# Patient Record
Sex: Female | Born: 2000 | Hispanic: Yes | Marital: Single | Smoking: Never smoker
Health system: Southern US, Community
[De-identification: ages and names within clinical notes are randomized; demographics above are authoritative.]

## PROBLEM LIST (undated history)

## (undated) ENCOUNTER — Inpatient Hospital Stay (HOSPITAL_COMMUNITY): Payer: Self-pay

## (undated) DIAGNOSIS — Z789 Other specified health status: Secondary | ICD-10-CM

## (undated) HISTORY — DX: Other specified health status: Z78.9

## (undated) HISTORY — PX: NO PAST SURGERIES: SHX2092

---
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
## 2020-05-31 ENCOUNTER — Encounter (HOSPITAL_COMMUNITY): Payer: Self-pay | Admitting: Student

## 2020-05-31 ENCOUNTER — Other Ambulatory Visit: Payer: Self-pay

## 2020-05-31 DIAGNOSIS — R112 Nausea with vomiting, unspecified: Secondary | ICD-10-CM | POA: Diagnosis present

## 2020-05-31 DIAGNOSIS — U071 COVID-19: Secondary | ICD-10-CM | POA: Insufficient documentation

## 2020-05-31 NOTE — ED Triage Notes (Signed)
Patient BIB POV c/o abdominal pain, headache, N/V/D, body aches, and weakness for the past 4 days.  Patient has not been tested Covid recently and she is not vaccinated.

## 2020-06-01 ENCOUNTER — Emergency Department (HOSPITAL_COMMUNITY)
Admission: EM | Admit: 2020-06-01 | Discharge: 2020-06-01 | Disposition: A | Discharge status: Home / Self Care | Payer: Medicaid Other | Attending: Emergency Medicine | Admitting: Emergency Medicine

## 2020-06-01 ENCOUNTER — Emergency Department (HOSPITAL_COMMUNITY): Payer: Medicaid Other

## 2020-06-01 DIAGNOSIS — R112 Nausea with vomiting, unspecified: Secondary | ICD-10-CM

## 2020-06-01 DIAGNOSIS — U071 COVID-19: Secondary | ICD-10-CM

## 2020-06-01 LAB — COMPREHENSIVE METABOLIC PANEL
ALT: 15 U/L (ref 0–44)
AST: 18 U/L (ref 15–41)
Albumin: 4.3 g/dL (ref 3.5–5.0)
Alkaline Phosphatase: 62 U/L (ref 38–126)
Anion gap: 11 (ref 5–15)
BUN: 10 mg/dL (ref 6–20)
CO2: 24 mmol/L (ref 22–32)
Calcium: 9.4 mg/dL (ref 8.9–10.3)
Chloride: 101 mmol/L (ref 98–111)
Creatinine, Ser: 0.62 mg/dL (ref 0.44–1.00)
GFR, Estimated: >60 mL/min (ref >60)
Glucose, Bld: 90 mg/dL (ref 70–99)
Potassium: 3.4 mmol/L — ABNORMAL LOW (ref 3.5–5.1)
Sodium: 136 mmol/L (ref 135–145)
Total Bilirubin: 0.5 mg/dL (ref 0.3–1.2)
Total Protein: 7.9 g/dL (ref 6.5–8.1)

## 2020-06-01 LAB — CBC WITH DIFFERENTIAL/PLATELET
Abs Immature Granulocytes: 0.01 10*3/uL (ref 0.00–0.07)
Basophils Absolute: 0 10*3/uL (ref 0.0–0.1)
Basophils Relative: 0 %
Eosinophils Absolute: 0.2 10*3/uL (ref 0.0–0.5)
Eosinophils Relative: 4 %
HCT: 36.3 % (ref 36.0–46.0)
Hemoglobin: 12.2 g/dL (ref 12.0–15.0)
Immature Granulocytes: 0 %
Lymphocytes Relative: 28 %
Lymphs Abs: 1.3 10*3/uL (ref 0.7–4.0)
MCH: 30 pg (ref 26.0–34.0)
MCHC: 33.6 g/dL (ref 30.0–36.0)
MCV: 89.4 fL (ref 80.0–100.0)
Monocytes Absolute: 0.7 10*3/uL (ref 0.1–1.0)
Monocytes Relative: 15 %
Neutro Abs: 2.5 10*3/uL (ref 1.7–7.7)
Neutrophils Relative %: 53 %
Platelets: 209 10*3/uL (ref 150–400)
RBC: 4.06 MIL/uL (ref 3.87–5.11)
RDW: 12 % (ref 11.5–15.5)
WBC: 4.7 10*3/uL (ref 4.0–10.5)
nRBC: 0 % (ref 0.0–0.2)

## 2020-06-01 LAB — URINALYSIS, ROUTINE W REFLEX MICROSCOPIC
Bilirubin Urine: NEGATIVE
Glucose, UA: NEGATIVE mg/dL
Ketones, ur: NEGATIVE mg/dL
Leukocytes,Ua: NEGATIVE
Nitrite: NEGATIVE
Protein, ur: NEGATIVE mg/dL
Specific Gravity, Urine: 1.03 (ref 1.005–1.030)
pH: 6 (ref 5.0–8.0)

## 2020-06-01 LAB — RESP PANEL BY RT-PCR (FLU A&B, COVID) ARPGX2
Influenza A by PCR: NEGATIVE
Influenza B by PCR: NEGATIVE
SARS Coronavirus 2 by RT PCR: POSITIVE — AB

## 2020-06-01 LAB — I-STAT BETA HCG BLOOD, ED (MC, WL, AP ONLY): I-stat hCG, quantitative: <5 m[IU]/mL (ref <5)

## 2020-06-01 LAB — LIPASE, BLOOD: Lipase: 21 U/L (ref 11–51)

## 2020-06-01 MED ORDER — ONDANSETRON HCL 4 MG/2ML IJ SOLN
4.0000 mg | Freq: Once | INTRAMUSCULAR | Status: AC
  Administered 2020-06-01: 4 mg via INTRAVENOUS
  Filled 2020-06-01: qty 2

## 2020-06-01 MED ORDER — ONDANSETRON 4 MG PO TBDP: 4.0000 mg | ORAL_TABLET | Freq: Four times a day (QID) | ORAL | 0 refills | Status: AC

## 2020-06-01 MED ORDER — MORPHINE SULFATE (PF) 4 MG/ML IV SOLN
4.0000 mg | Freq: Once | INTRAVENOUS | Status: AC
  Administered 2020-06-01: 4 mg via INTRAVENOUS
  Filled 2020-06-01: qty 1

## 2020-06-01 MED ORDER — FAMOTIDINE IN NACL 20-0.9 MG/50ML-% IV SOLN
20.0000 mg | Freq: Once | INTRAVENOUS | Status: AC
  Administered 2020-06-01: 20 mg via INTRAVENOUS
  Filled 2020-06-01: qty 50

## 2020-06-01 MED ORDER — SODIUM CHLORIDE 0.9 % IV BOLUS
1000.0000 mL | Freq: Once | INTRAVENOUS | Status: AC
  Administered 2020-06-01: 1000 mL via INTRAVENOUS

## 2020-06-01 NOTE — ED Provider Notes (Signed)
[facility]-EMERGENCY DEPT Provider Note   CSN: [id number] Arrival date & time: 05/31/20  1809     History Chief Complaint  Patient presents with  . Emesis  . Weakness  . Nausea  . Abdominal Pain    Ariel Underwood is a 19 y.o. female.  Ariel Underwood is a 19 y.o. female who is otherwise healthy, presents to the emergency department for evaluation of abdominal pain, nausea, vomiting, diarrhea, headache and body aches.  She reports symptoms have been present for the past 4 days.  She states she has had chills but no documented fever.  Has had fatigue and general malaise.  Reports intermittent headaches, no visual changes, numbness tingling or weakness, no neck pain or stiffness.  Reports she has had persistent vomiting and has not been able to keep much of anything down and has had some intermittent nonbloody diarrhea.  Reports some generalized abdominal pain that has worsened today.  Has also had some nasal congestion and occasional cough.  Reports some chest tightness primarily after coughing, no shortness of breath.  Denies known sick contacts but was gathered with multiple family members for Thanksgiving.  Has not had Covid vaccine.  No meds prior to arrival, no other aggravating or alleviating factors.        Past Medical History:  Diagnosis Date  . Medical history non-contributory     Patient Active Problem List   Diagnosis Date Noted  . Chlamydia 12/30/2017  . Encounter for supervision of normal pregnancy in teen primigravida, antepartum 11/05/2017  . Short interval between pregnancies affecting pregnancy, antepartum 11/05/2017    Past Surgical History:  Procedure Laterality Date  . NO PAST SURGERIES       OB History    Gravida  2   Para  1   Term  1   Preterm      AB      Living  1     SAB      TAB      Ectopic      Multiple      Live Births  1           Family History  Problem Relation Age of Onset  . Diabetes  Paternal Grandmother   . Hypertension Paternal Grandmother   . Cancer Neg Hx     Social History   Tobacco Use  . Smoking status: Never Smoker  . Smokeless tobacco: Never Used  Substance Use Topics  . Alcohol use: Never  . Drug use: Never    Home Medications Prior to Admission medications   Medication Sig Start Date End Date Taking? Authorizing Provider  ibuprofen (ADVIL) 200 MG tablet Take 400 mg by mouth every 6 (six) hours as needed for moderate pain.   Yes [provider]  ondansetron (ZOFRAN ODT) 4 MG disintegrating tablet Take 1 tablet (4 mg total) by mouth every 6 (six) hours. 06/01/20   Dartha LodgeFord, Orlander Norwood N, PA-C    Allergies    Patient has no known allergies.  Review of Systems   Review of Systems  Constitutional: Positive for chills. Negative for fever.  HENT: Positive for congestion and rhinorrhea. Negative for sore throat.   Respiratory: Positive for cough. Negative for shortness of breath.   Cardiovascular: Positive for chest pain.  Gastrointestinal: Positive for abdominal pain, diarrhea, nausea and vomiting. Negative for blood in stool.  Genitourinary: Negative for dysuria, frequency, vaginal bleeding and vaginal discharge.  Musculoskeletal: Negative for arthralgias and myalgias.  Skin:  Negative for color change and rash.  Neurological: Positive for headaches. Negative for dizziness, syncope and light-headedness.  All other systems reviewed and are negative.   Physical Exam Updated Vital Signs BP (!) 100/58   Pulse 64   Temp 98.8 F (37.1 C) (Oral)   Resp 13   Ht 5\' 4"  (1.626 m)   Wt 49.9 kg   SpO2 97%   BMI 18.88 kg/m   Physical Exam Vitals and nursing note reviewed.  Constitutional:      General: She is not in acute distress.    Appearance: She is well-developed and normal weight. She is not ill-appearing or diaphoretic.     Comments: Well-appearing and in no distress  HENT:     Head: Normocephalic and atraumatic.     Nose: Congestion and  rhinorrhea present.     Mouth/Throat:     Mouth: Mucous membranes are moist.     Pharynx: Oropharynx is clear.     Comments: Posterior oropharynx clear and mucous membranes moist, there is mild erythema but no edema or tonsillar exudates, uvula midline, normal phonation Eyes:     General:        Right eye: No discharge.        Left eye: No discharge.     Pupils: Pupils are equal, round, and reactive to light.  Cardiovascular:     Rate and Rhythm: Normal rate and regular rhythm.     Heart sounds: Normal heart sounds. No murmur heard.  No friction rub. No gallop.   Pulmonary:     Effort: Pulmonary effort is normal. No respiratory distress.     Breath sounds: Normal breath sounds. No wheezing or rales.     Comments: Respirations equal and unlabored, patient able to speak in full sentences, lungs clear to auscultation bilaterally Abdominal:     General: Bowel sounds are normal. There is no distension.     Palpations: Abdomen is soft. There is no mass.     Tenderness: There is abdominal tenderness. There is no guarding.     Comments: Abdomen is soft, nondistended, bowel sounds present throughout, there is some generalized tenderness throughout the abdomen that does not localize to one quadrant, no guarding or peritoneal signs, no CVA tenderness bilaterally  Musculoskeletal:        General: No deformity.     Cervical back: Normal range of motion and neck supple. No rigidity.     Right lower leg: No edema.     Left lower leg: No edema.  Skin:    General: Skin is warm and dry.     Capillary Refill: Capillary refill takes less than 2 seconds.  Neurological:     Mental Status: She is alert and oriented to person, place, and time.     Coordination: Coordination normal.     Comments: Speech is clear, able to follow commands Moves extremities without ataxia, coordination intact  Psychiatric:        Mood and Affect: Mood normal.        Behavior: Behavior normal.     ED Results /  Procedures / Treatments   Labs (all labs ordered are listed, but only abnormal results are displayed) Labs Reviewed  RESP PANEL BY RT-PCR (FLU A&B, COVID) ARPGX2 - Abnormal; Notable for the following components:      Result Value   SARS Coronavirus 2 by RT PCR POSITIVE (*)    All other components within normal limits  COMPREHENSIVE METABOLIC PANEL - Abnormal; Notable for  the following components:   Potassium 3.4 (*)    All other components within normal limits  URINALYSIS, ROUTINE W REFLEX MICROSCOPIC - Abnormal; Notable for the following components:   Hgb urine dipstick SMALL (*)    Bacteria, UA FEW (*)    All other components within normal limits  CBC WITH DIFFERENTIAL/PLATELET  LIPASE, BLOOD  I-STAT BETA HCG BLOOD, ED ([facility], [facility], [facility] ONLY)    EKG EKG Interpretation  Date/Time:  Thursday June 01 2020 02:35:07 EST Ventricular Rate:  77 PR Interval:    QRS Duration: 87 QT Interval:  369 QTC Calculation: 418 R Axis:   79 Text Interpretation: Sinus rhythm Atrial premature complex No old tracing to compare Confirmed by Marily Memos [phone]) on 06/01/2020 6:26:07 AM   Radiology DG Chest Port 1 View  Result Date: 06/01/2020 CLINICAL DATA:  Chest pain and cough with fevers EXAM: PORTABLE CHEST 1 VIEW COMPARISON:  None. FINDINGS: The heart size and mediastinal contours are within normal limits. Both lungs are clear. The visualized skeletal structures are unremarkable. IMPRESSION: No active disease. Electronically Signed   By: Alcide Clever M.D.   On: 06/01/2020 02:48    Procedures Procedures (including critical care time)  Medications Ordered in ED Medications  sodium chloride 0.9 % bolus 1,000 mL (0 mLs Intravenous Stopped 06/01/20 0511)  ondansetron (ZOFRAN) injection 4 mg (4 mg Intravenous Given 06/01/20 0346)  morphine 4 MG/ML injection 4 mg (4 mg Intravenous Given 06/01/20 0345)  famotidine (PEPCID) IVPB 20 mg premix (0 mg Intravenous Stopped 06/01/20 0511)  ondansetron  (ZOFRAN) injection 4 mg (4 mg Intravenous Given 06/01/20 0606)    ED Course  I have reviewed the triage vital signs and the nursing notes.  Pertinent labs & imaging results that were available during my care of the patient were reviewed by me and considered in my medical decision making (see chart for details).    MDM Rules/Calculators/A&P                          19 year old female presents with chills, body aches, headache, nausea, vomiting and diarrhea over the past 4 days is also had some nasal congestion and cough, unsure of sick contacts but was gathered with family for Thanksgiving, has not been vaccinated against Covid.  On exam patient is afebrile with normal vitals, overall well-appearing and in no acute distress, abdomen with some mild generalized tenderness, does not localize to one quadrant, no peritoneal signs.  Lungs are clear.  High clinical suspicion for Covid infection, but will also check abdominal labs, chest x-ray and EKG.  Patient given IV fluids and symptomatic treatment.  Do not feel that abdominal imaging is indicated at this time.  Patient denies urinary symptoms or vaginal discharge.  I have independently ordered, reviewed and interpreted all labs and imaging: CBC: No leukocytosis, normal hemoglobin CMP: Minimal hypokalemia of 3.4, no other electrolyte derangements, normal renal and liver function Lipase: WNL Pregnancy: Negative UA: No hematuria or signs of infection.  Chest x-ray without evidence of pneumonia or other active cardiopulmonary disease.  EKG shows sinus rhythm with PAC, no ischemic changes.  Patient's Covid test has returned positive, this certainly explains her array of symptoms.  Her nausea and vomiting has improved with treatment here in the ED and she has tolerated p.o. fluids.  Abdominal exam remains benign.  At this time feel patient is stable for discharge home with continued supportive treatment for Covid symptoms.  Will discharge  with Zofran  and discussed other over-the-counter medications.  She does not have medical history that would qualify her for monoclonal antibody infusion.  Discussed outpatient follow-up and strict return precautions.  She expresses understanding and agreement.  Discharged home in good condition.    Ariel Underwood was evaluated in Emergency Department on 06/01/2020 for the symptoms described in the history of present illness. She was evaluated in the context of the global COVID-19 pandemic, which necessitated consideration that the patient might be at risk for infection with the SARS-CoV-2 virus that causes COVID-19. Institutional protocols and algorithms that pertain to the evaluation of patients at risk for COVID-19 are in a state of rapid change based on information released by regulatory bodies including the CDC and federal and state organizations. These policies and algorithms were followed during the patient's care in the ED.  Final Clinical Impression(s) / ED Diagnoses Final diagnoses:  COVID-19 virus infection  Non-intractable vomiting with nausea, unspecified vomiting type    Rx / DC Orders ED Discharge Orders         Ordered    ondansetron (ZOFRAN ODT) 4 MG disintegrating tablet  Every 6 hours        06/01/20 0624           Dartha Lodge, PA-C 06/01/20 0654    Marily Memos, MD 06/01/20 [phone]

## 2020-06-01 NOTE — Discharge Instructions (Addendum)
You have tested positive for COVID-19 virus.  Please continue to quarantine at home and monitor your symptoms closely. You chest x-ray was clear. Antibiotics are not helpful in treating viral infection, the virus should run its course in about 10-14 days. Please make sure you are drinking plenty of fluids. You can treat your symptoms supportively with tylenol for fevers and pains, and over the counter cough syrups and throat lozenges to help with cough.  Take Zofran for nausea and vomiting, start with clear liquids and as you are able to keep these down you can add back bland foods and then slowly return to your normal diet.  If your symptoms are not improving please follow up with you Primary doctor.   I recommend that you purchase a home pulse ox to help better monitor your oxygen at home, if you start to have increased work of breathing or shortness of breath or your oxygen drops below 90% please immediately return to the hospital for reevaluation.  If you develop persistent fevers, shortness of breath or difficulty breathing, chest pain, severe headache and neck pain, persistent nausea and vomiting or other new or concerning symptoms return to the Emergency department.

## 2020-06-02 ENCOUNTER — Other Ambulatory Visit: Payer: Self-pay | Admitting: Nurse Practitioner

## 2020-06-02 DIAGNOSIS — U071 COVID-19: Secondary | ICD-10-CM

## 2020-06-02 NOTE — Progress Notes (Signed)
I connected by phone with Ariel Underwood on 06/02/2020 at 12:24 PM to discuss the potential use of a treatment for mild to moderate COVID-19 viral infection in non-hospitalized patients.  This patient is a 19 y.o. female that meets the FDA criteria for Emergency Use Authorization of bamlanivimab/etesevimab, casirivimab\imdevimab, or sotrovimab  Has a (+) direct SARS-CoV-2 viral test result  Has mild or moderate COVID-19   Is ? 19 years of age and weighs ? 40 kg  Is NOT hospitalized due to COVID-19  Is NOT requiring oxygen therapy or requiring an increase in baseline oxygen flow rate due to COVID-19  Is within 10 days of symptom onset  Has at least one of the high risk factor(s) for progression to severe COVID-19 and/or hospitalization as defined in EUA.  Specific high risk criteria : Other high risk medical condition per CDC:  social vulnerability   I have spoken and communicated the following to the patient or parent/caregiver:  1. FDA has authorized the emergency use of bamlanivimab/etesevimab, casirivimab\imdevimab, or sotrovimab for the treatment of mild to moderate COVID-19 in adults and pediatric patients with positive results of direct SARS-CoV-2 viral testing who are 62 years of age and older weighing at least 40 kg, and who are at high risk for progressing to severe COVID-19 and/or hospitalization.  2. The significant known and potential risks and benefits of bamlanivimab/etesevimab, casirivimab\imdevimab, or sotrovimab, and the extent to which such potential risks and benefits are unknown.  3. Information on available alternative treatments and the risks and benefits of those alternatives, including clinical trials.  4. Patients treated with bamlanivimab/etesevimab, casirivimab\imdevimab, or sotrovimab should continue to self-isolate and use infection control measures (e.g., wear mask, isolate, social distance, avoid sharing personal items, clean and disinfect "high touch"  surfaces, and frequent handwashing) according to CDC guidelines.   5. The patient or parent/caregiver has the option to accept or refuse bamlanivimab/etesevimab, casirivimab\imdevimab, or sotrovimab.  After reviewing this information with the patient, the patient has agreed to receive one of the available covid 19 monoclonal antibodies and will be provided an appropriate fact sheet prior to infusion.Consuello Masse, DNP, AGNP-C [phone] (Infusion Center Hotline)

## 2021-07-08 IMAGING — DX DG CHEST 1V PORT
1 series · 1 of 1 positions shown · non-contrast
Comparison: None.

CLINICAL DATA: Chest pain and cough with fevers

EXAM:
PORTABLE CHEST 1 VIEW

[chest ap]
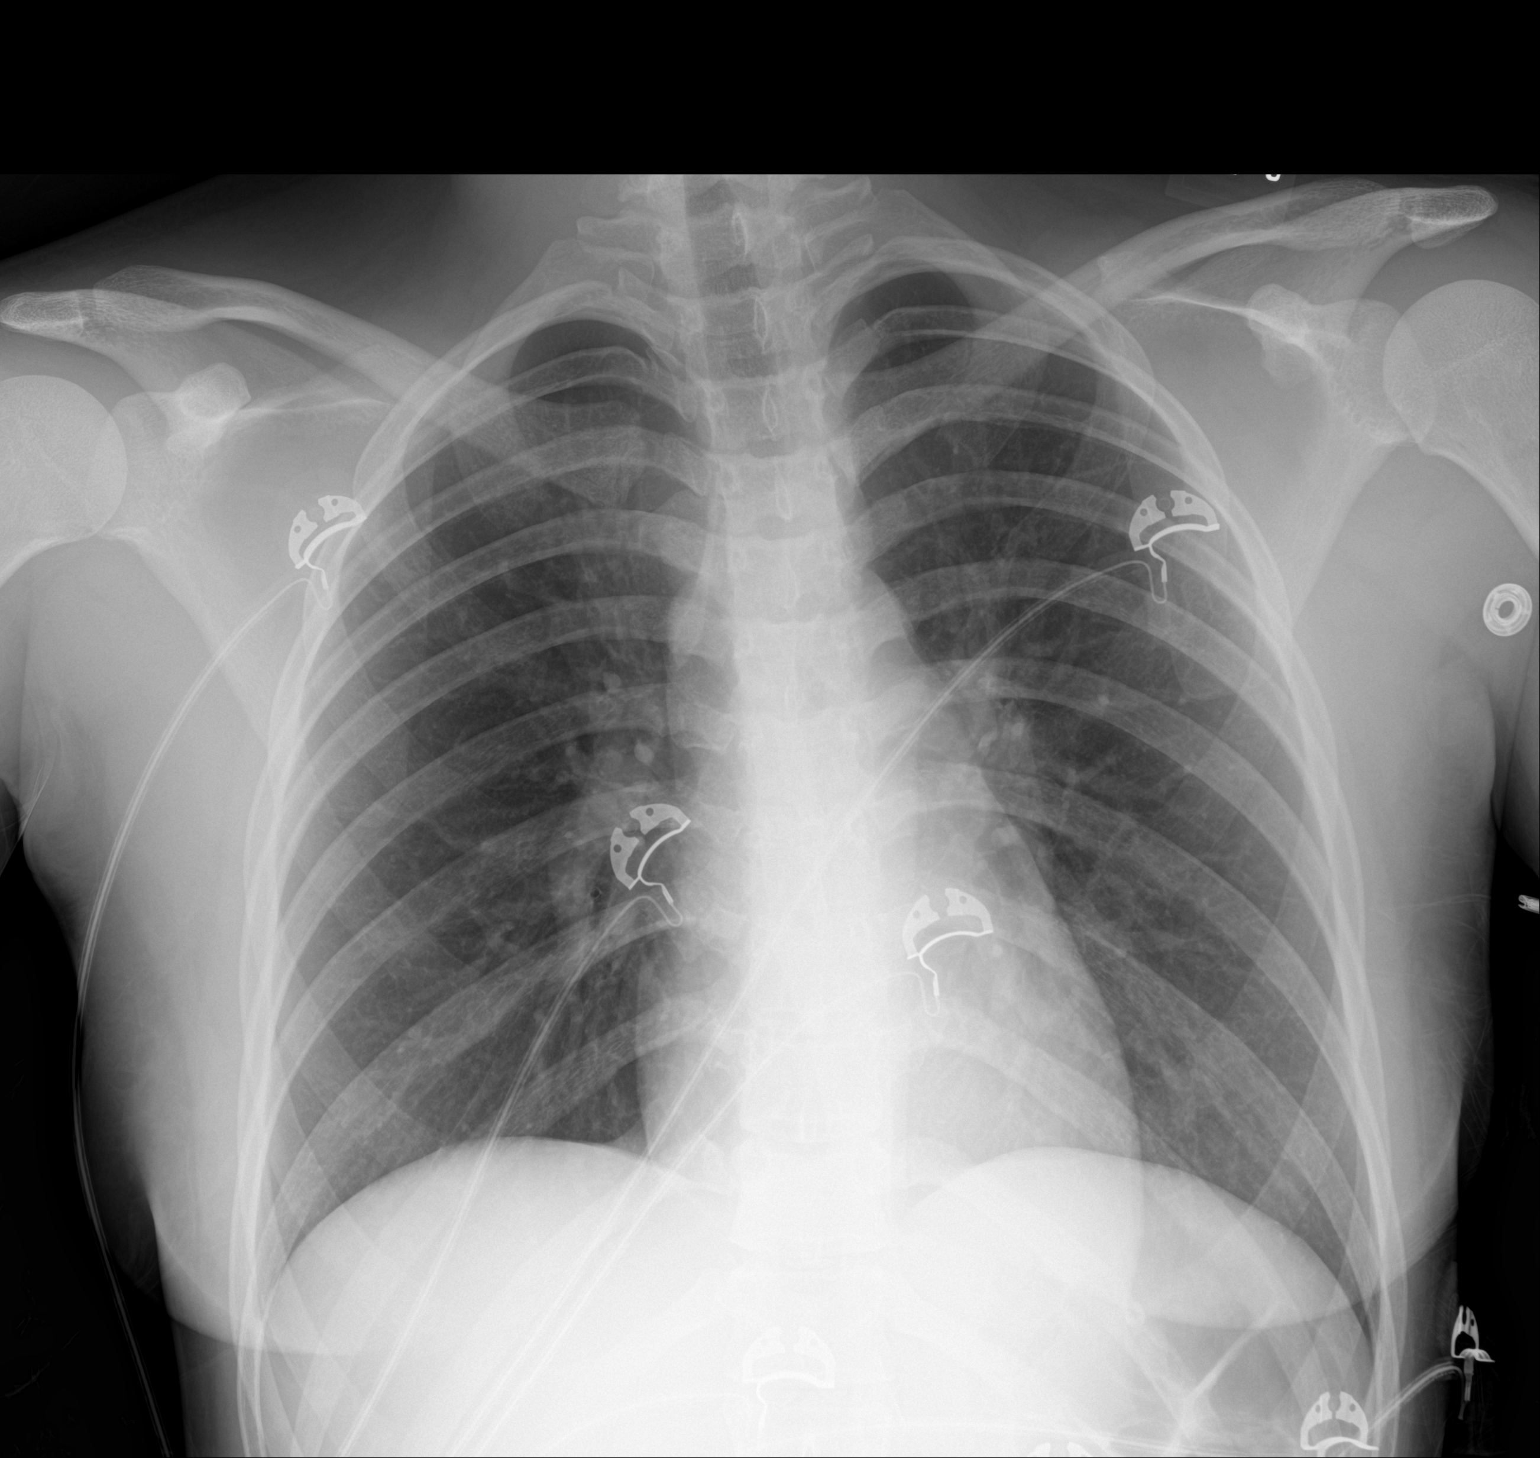

[1 of 1 positions shown; findings below may reference images not displayed]

FINDINGS: The heart size and mediastinal contours are within normal limits.
Both lungs are clear. The visualized skeletal structures are
unremarkable.
IMPRESSION: No active disease.
